# Patient Record
Sex: Male | Born: 1995 | Race: White | Hispanic: No | Marital: Single | Smoking: Never smoker
Health system: Southern US, Community
[De-identification: ages and names within clinical notes are randomized; demographics above are authoritative.]

## PROBLEM LIST (undated history)

## (undated) DIAGNOSIS — F32A Depression, unspecified: Secondary | ICD-10-CM

## (undated) DIAGNOSIS — F419 Anxiety disorder, unspecified: Secondary | ICD-10-CM

## (undated) DIAGNOSIS — F329 Major depressive disorder, single episode, unspecified: Secondary | ICD-10-CM

## (undated) DIAGNOSIS — F411 Generalized anxiety disorder: Secondary | ICD-10-CM

## (undated) HISTORY — PX: OTHER SURGICAL HISTORY: SHX169

## (undated) HISTORY — DX: Generalized anxiety disorder: F41.1

## (undated) HISTORY — DX: Major depressive disorder, single episode, unspecified: F32.9

## (undated) HISTORY — DX: Anxiety disorder, unspecified: F41.9

## (undated) HISTORY — DX: Depression, unspecified: F32.A

---
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
## 2016-01-16 ENCOUNTER — Other Ambulatory Visit: Payer: Self-pay | Admitting: Family Medicine

## 2016-02-23 ENCOUNTER — Other Ambulatory Visit: Payer: Self-pay | Admitting: Family Medicine

## 2016-02-23 MED ORDER — DULOXETINE HCL 60 MG PO CPEP
ORAL_CAPSULE | ORAL | Status: DC
Start: 2016-02-23 — End: 2017-02-09

## 2017-02-08 ENCOUNTER — Telehealth: Payer: Self-pay | Admitting: Family Medicine

## 2017-02-08 NOTE — Telephone Encounter (Signed)
[name] calling from [facility] stating that he has hit a plateau on Cymbalta and would like to know if we can switch him to Prestiq?    LOV 03/12/15  Please call her back at [phone]

## 2017-02-08 NOTE — Telephone Encounter (Signed)
Dc cymbalta and try pristiq 50 poqday and recheck here in 1 month.

## 2017-02-08 NOTE — Telephone Encounter (Signed)
Called lauren back left message with office to return call as she has left for the day.

## 2017-02-09 MED ORDER — DESVENLAFAXINE SUCCINATE ER 50 MG PO TB24: 50.0000 mg | ORAL_TABLET | Freq: Every day | ORAL | 3 refills | Status: DC

## 2017-02-09 NOTE — Telephone Encounter (Signed)
Spoke to [employer] MillsLauren and informed of ok for Pristiq. She will contact pt and keep in touch with him and will have him call to come in for 1 month f/u. [facility]

## 2017-04-11 ENCOUNTER — Telehealth: Payer: Self-pay | Admitting: Family Medicine

## 2017-04-11 NOTE — Telephone Encounter (Signed)
[name] calling from [facility] about pt stating that he has hit a plateu on Prestiq and would like to know if we can increase it to 100mg . (pt did not come in for a 1 months f/u after starting the Prestiq and has not been seen here since 2016).   Please call her at [phone]

## 2017-04-12 NOTE — Telephone Encounter (Signed)
Lauren aware and will contact pt to set up appt.

## 2017-04-12 NOTE — Telephone Encounter (Signed)
ntbs

## 2017-06-01 ENCOUNTER — Other Ambulatory Visit: Payer: Self-pay | Admitting: Family Medicine

## 2017-06-01 NOTE — Telephone Encounter (Signed)
NTBS, one month supply only

## 2017-06-01 NOTE — Telephone Encounter (Signed)
Ok to refill??      LOV - 03/12/15

## 2017-08-23 ENCOUNTER — Encounter: Payer: Self-pay | Admitting: Family Medicine

## 2017-08-23 ENCOUNTER — Ambulatory Visit: Payer: BC Managed Care – PPO | Admitting: Family Medicine

## 2017-08-23 VITALS — BP 106/62 | HR 80 | Temp 98.4°F | Resp 14 | Ht 72.0 in | Wt 211.0 lb

## 2017-08-23 DIAGNOSIS — F3341 Major depressive disorder, recurrent, in partial remission: Secondary | ICD-10-CM | POA: Diagnosis not present

## 2017-08-23 MED ORDER — DESVENLAFAXINE SUCCINATE ER 100 MG PO TB24: 100.0000 mg | ORAL_TABLET | Freq: Every day | ORAL | 5 refills | Status: DC

## 2017-08-23 NOTE — Progress Notes (Signed)
   Subjective:    Patient ID: Matthew Sanchez, male    DOB: June 01, 1996, 21 y.o.   MRN: [id number]  HPI Patient has not been seen in several years. He eventually switch to Effexor XR to Pristiq 50 mg a day due to side effects that he experienced Effexor XR. Side effects included nausea and upset stomach. Since being on Pristiq, his side effects improved dramatically. He is seeing substantial benefit on this medication as well however he still has symptoms of depression and anxiety they're not adequately controlled and he is interested in increasing the medication 100 mg a day. He denies any suicidal ideation. He reports some anhedonia. He reports excessive feelings of anxiety and dread at times without cause. He reports low energy and poor concentration Past Medical History:  Diagnosis Date  . Anxiety   . Depression   . GAD (generalized anxiety disorder)    Past Surgical History:  Procedure Laterality Date  . tubes in ears     No current outpatient medications on file prior to visit.   No current facility-administered medications on file prior to visit.    No Known Allergies Social History   Socioeconomic History  . Marital status: Single    Spouse name: Not on file  . Number of children: Not on file  . Years of education: Not on file  . Highest education level: Not on file  Social Needs  . Financial resource strain: Not on file  . Food insecurity - worry: Not on file  . Food insecurity - inability: Not on file  . Transportation needs - medical: Not on file  . Transportation needs - non-medical: Not on file  Occupational History  . Not on file  Tobacco Use  . Smoking status: Never Smoker  . Smokeless tobacco: Never Used  Substance and Sexual Activity  . Alcohol use: No  . Drug use: No  . Sexual activity: Not on file  Other Topics Concern  . Not on file  Social History Narrative  . Not on file      Review of Systems  All other systems reviewed and are  negative.      Objective:   Physical Exam  Cardiovascular: Normal rate, regular rhythm and normal heart sounds.  Pulmonary/Chest: Effort normal and breath sounds normal. No respiratory distress. He has no wheezes. He has no rales.  Abdominal: Soft. Bowel sounds are normal.  Psychiatric: He has a normal mood and affect. His behavior is normal. Judgment and thought content normal.  Vitals reviewed.         Assessment & Plan:  MDD Increase Pristiq from 50 mg to 100 mg a day and reassess in 4-6 weeks. Consider adding Wellbutrin as symptoms are still not controlled versus adding Lamictal

## 2018-03-08 ENCOUNTER — Other Ambulatory Visit: Payer: Self-pay | Admitting: Family Medicine

## 2018-12-26 ENCOUNTER — Other Ambulatory Visit: Payer: Self-pay

## 2018-12-26 ENCOUNTER — Ambulatory Visit (INDEPENDENT_AMBULATORY_CARE_PROVIDER_SITE_OTHER): Payer: BC Managed Care – PPO | Admitting: Family Medicine

## 2018-12-26 ENCOUNTER — Encounter: Payer: Self-pay | Admitting: Family Medicine

## 2018-12-26 VITALS — BP 100/72 | HR 80 | Temp 98.4°F | Resp 16 | Ht 72.0 in | Wt 222.0 lb

## 2018-12-26 DIAGNOSIS — F3341 Major depressive disorder, recurrent, in partial remission: Secondary | ICD-10-CM

## 2018-12-26 DIAGNOSIS — F411 Generalized anxiety disorder: Secondary | ICD-10-CM

## 2018-12-26 MED ORDER — CLONAZEPAM 0.5 MG PO TABS: 0.5000 mg | ORAL_TABLET | Freq: Two times a day (BID) | ORAL | 1 refills | Status: AC | PRN

## 2018-12-26 MED ORDER — DESVENLAFAXINE SUCCINATE ER 100 MG PO TB24: 100.0000 mg | ORAL_TABLET | Freq: Every day | ORAL | 5 refills | Status: AC

## 2018-12-26 NOTE — Progress Notes (Signed)
Subjective:    Patient ID: Matthew Sanchez, male    DOB: [birth year], 23 y.o.   MRN: [id number]  HPI Patient is a very pleasant 23 year old Caucasian male who I last saw on December 2018.  He has a history of depression and has tried and failed numerous medications however he saw significant benefit from Pristiq and ultimately was on 100 mg at his last office visit.  After that he did exceptionally well.  His depression improved as did anxiety.  In fact he was doing so well that he discontinued the medication and has been off medication now for many many months.  He states that he did start to have increasing anxiety a few months ago prior to the current coronavirus pandemic.  However over the last 2 months, the anxiety has gotten much worse.  He is isolated at home.  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  This leaves him feeling very isolated and sad and depressed.  He also feels increasing anxiety about getting all of his requirements for graduation from college completed in time.  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  This is gradually worsened over the last 3 to 4 weeks to the point that he feels extremely anxious all day long.  He is also occasionally having panic attacks.  His anxiety is to the point that it is hard for him to concentrate.  At times he feels helpless and overwhelmed.  This is causing him to feel depressed and sad.  He also reports anhedonia.  He reports insomnia due to his mind constantly ruminating over his anxieties and fears.  He denies any suicidal ideation or homicidal ideation.  He is here today to discuss treatment options and is interested in resuming his medication Past Medical History:  Diagnosis Date  . Anxiety   . Depression   . GAD (generalized anxiety disorder)     Past Surgical History:  Procedure Laterality Date  . tubes in ears     No current outpatient medications on file prior to visit.   No current facility-administered medications on file prior to visit.    No Known Allergies Social History   Socioeconomic History  . Marital status: Single    Spouse name: Not on file  . Number of children: Not on file  . Years of education: Not on file  . Highest education level: Not on file  Occupational History  . Not on file  Social Needs  . Financial resource strain: Not on file  . Food insecurity:    Worry: Not on file    Inability: Not on file  . Transportation needs:    Medical: Not on file    Non-medical: Not on file  Tobacco Use  . Smoking status: Never Smoker  . Smokeless tobacco: Never Used  Substance and Sexual Activity  . Alcohol use: No  . Drug use: No  . Sexual activity: Not on file  Lifestyle  . Physical activity:    Days per week: Not on file    Minutes per session: Not on file  . Stress: Not on file  Relationships  . Social connections:    Talks on phone: Not on file    Gets together: Not on file    Attends religious service: Not  on file    Active member of club or organization: Not on file    Attends meetings of clubs or organizations: Not on file    Relationship status: Not on file  . Intimate partner violence:    Fear of current or ex partner: Not on file    Emotionally abused: Not on file    Physically abused: Not on file    Forced sexual activity: Not on file  Other Topics Concern  . Not on file  Social History Narrative  . Not on file      Review of Systems  All other systems reviewed and are negative.      Objective:   Physical Exam  Cardiovascular: Normal rate, regular rhythm and normal heart sounds.  Pulmonary/Chest: Effort normal and breath sounds normal. No respiratory distress. He has no wheezes. He has no rales.  Abdominal: Soft. Bowel sounds are normal.  Psychiatric: He has a  normal mood and affect. His behavior is normal. Judgment and thought content normal.  Vitals reviewed.         Assessment & Plan:  Recurrent major depressive disorder, in partial remission (HCC)  GAD (generalized anxiety disorder)  I will resume the patient's Pristiq 100 mg a day and give him Klonopin 0.5 mg p.o. twice daily as needed anxiety for short-term use until the medication takes full effect.  Hopefully as the procedure takes effect, he will not require the Klonopin to help manage his anxiety.  Reassess in 4 weeks or sooner if worsening.
